# Patient Record
Sex: Female | Born: 1955 | Race: White | Hispanic: No | Marital: Married | Smoking: Never smoker
Health system: Southern US, Community
[De-identification: ages and names within clinical notes are randomized; demographics above are authoritative.]

## PROBLEM LIST (undated history)

## (undated) DIAGNOSIS — I10 Essential (primary) hypertension: Secondary | ICD-10-CM

---

## 2005-04-29 ENCOUNTER — Other Ambulatory Visit: Admission: RE | Admit: 2005-04-29 | Discharge: 2005-04-29 | Payer: Self-pay | Admitting: Internal Medicine

## 2005-06-27 ENCOUNTER — Encounter: Admission: RE | Admit: 2005-06-27 | Discharge: 2005-06-27 | Payer: Self-pay | Admitting: Internal Medicine

## 2006-04-16 ENCOUNTER — Ambulatory Visit: Payer: Self-pay | Admitting: Internal Medicine

## 2006-05-06 ENCOUNTER — Ambulatory Visit: Payer: Self-pay | Admitting: Internal Medicine

## 2007-02-02 ENCOUNTER — Encounter: Admission: RE | Admit: 2007-02-02 | Discharge: 2007-02-02 | Payer: Self-pay | Admitting: Internal Medicine

## 2007-04-13 ENCOUNTER — Ambulatory Visit: Payer: Self-pay | Admitting: Internal Medicine

## 2007-04-13 DIAGNOSIS — R519 Headache, unspecified: Secondary | ICD-10-CM | POA: Insufficient documentation

## 2007-04-13 DIAGNOSIS — R51 Headache: Secondary | ICD-10-CM | POA: Insufficient documentation

## 2007-04-13 DIAGNOSIS — G4700 Insomnia, unspecified: Secondary | ICD-10-CM | POA: Insufficient documentation

## 2007-04-13 DIAGNOSIS — L719 Rosacea, unspecified: Secondary | ICD-10-CM | POA: Insufficient documentation

## 2007-04-13 LAB — CONVERTED CEMR LAB
BUN: 11 mg/dL (ref 6–23)
Basophils Absolute: 0.1 10*3/uL (ref 0.0–0.1)
Basophils Relative: 1.2 % — ABNORMAL HIGH (ref 0.0–1.0)
CO2: 28 meq/L (ref 19–32)
Calcium: 9.6 mg/dL (ref 8.4–10.5)
Chloride: 102 meq/L (ref 96–112)
Creatinine, Ser: 1.1 mg/dL (ref 0.4–1.2)
Eosinophils Absolute: 0 10*3/uL (ref 0.0–0.6)
Eosinophils Relative: 0.8 % (ref 0.0–5.0)
GFR calc Af Amer: 68 mL/min
GFR calc non Af Amer: 56 mL/min
Glucose, Bld: 102 mg/dL — ABNORMAL HIGH (ref 70–99)
HCT: 36.5 % (ref 36.0–46.0)
Hemoglobin: 12.9 g/dL (ref 12.0–15.0)
Lymphocytes Relative: 19.8 % (ref 12.0–46.0)
MCHC: 35.3 g/dL (ref 30.0–36.0)
MCV: 91.9 fL (ref 78.0–100.0)
Monocytes Absolute: 0.3 10*3/uL (ref 0.2–0.7)
Monocytes Relative: 6.2 % (ref 3.0–11.0)
Neutro Abs: 3.7 10*3/uL (ref 1.4–7.7)
Neutrophils Relative %: 72 % (ref 43.0–77.0)
Platelets: 328 10*3/uL (ref 150–400)
Potassium: 4.3 meq/L (ref 3.5–5.1)
RBC: 3.97 M/uL (ref 3.87–5.11)
RDW: 12.7 % (ref 11.5–14.6)
Sodium: 137 meq/L (ref 135–145)
WBC: 5.1 10*3/uL (ref 4.5–10.5)

## 2007-04-22 ENCOUNTER — Ambulatory Visit: Payer: Self-pay | Admitting: Cardiology

## 2008-11-01 ENCOUNTER — Encounter: Admission: RE | Admit: 2008-11-01 | Discharge: 2008-11-01 | Payer: Self-pay | Admitting: Family Medicine

## 2008-12-21 ENCOUNTER — Other Ambulatory Visit: Admission: RE | Admit: 2008-12-21 | Discharge: 2008-12-21 | Payer: Self-pay | Admitting: Family Medicine

## 2009-11-05 ENCOUNTER — Encounter: Admission: RE | Admit: 2009-11-05 | Discharge: 2009-11-05 | Payer: Self-pay | Admitting: Family Medicine

## 2010-06-23 ENCOUNTER — Encounter: Payer: Self-pay | Admitting: Internal Medicine

## 2010-07-02 NOTE — Assessment & Plan Note (Signed)
Summary: mouth pain/cdj  Medications Added AMBIEN CR 6.25 MG TBCR (ZOLPIDEM TARTRATE) Take 1 tablet by mouth at bedtime SULFAMETHOXAZOLE-TMP DS 800-160 MG  TABS (SULFAMETHOXAZOLE-TRIMETHOPRIM) two times a day - RX'D BY DERM      Allergies Added: NKDA  Vital Signs:  Patient Profile:   55 Years Old Female Weight:      156.2 pounds Temp:     97.1 degrees F oral BP sitting:   180 / 100  Vitals Entered By: Shary Decamp (April 13, 2007 11:27 AM)                 Chief Complaint:  MOUTH PAIN X 7/08; WAS PUT ON DOXY BY DERM FOR ROSACEA - MOUTH PAIN STARTED 2 WEEKS AFTER THAT - DERM CHANGED TO BACTRIM - PT HAS ALSO SEEN DENTIST - WKUP WAS NORMAL; TONGUE COATED.  History of Present Illness: complain of pain at  the roof of the mouth and anterior gum  since July 2008 she has seen 2 dentists and they have determined after some x-rays that the pain is not coming from her teeth symptom started after she was prescribed doxycycline for rosacea,doxycycline was changed to Bactrim 4 weeks ago but the pain continued. the patient wonders if the symptoms were related to the  antibiotics  Current Allergies: No known allergies   Past Surgical History:    Tubal ligation    history of irregular periods,status post a D&C in 2005, a polyp was removed   Family History:    M - UTERINE CA    SIS - HODGKINS    Review of Systems  ENT      sinus congestion--no facial swelling no discoloration or mouth blisters   Physical Exam  General:     alert and well-developed.   Head:     face is symmetric, sinuses not tender Ears:     normal Nose:     not congested Mouth:     inspection of the mouth show no redness or lesion, palpation of the hard palate, tongue, teeth : normal. Neck:     no masses.      Impression & Recommendations:  Problem # 1:  FACIAL PAIN (ICD-784.0) CBC BMP CT Orders: TLB-BMP (Basic Metabolic Panel-BMET) (80048-METABOL) TLB-CBC Platelet - w/Differential  (85025-CBCD) Venipuncture (14782) Radiology Referral (Radiology)   Complete Medication List: 1)  Ambien Cr 6.25 Mg Tbcr (Zolpidem tartrate) .... Take 1 tablet by mouth at bedtime 2)  Sulfamethoxazole-tmp Ds 800-160 Mg Tabs (Sulfamethoxazole-trimethoprim) .... Two times a day - rx'd by derm     Prescriptions: AMBIEN CR 6.25 MG TBCR (ZOLPIDEM TARTRATE) Take 1 tablet by mouth at bedtime  #30 x 5   Entered by:   Shary Decamp   Authorized by:   Nolon Rod. Gitel Beste MD   Signed by:   Shary Decamp on 04/13/2007   Method used:   Print then Give to Patient   RxID:   [id number]  ]

## 2010-11-21 ENCOUNTER — Other Ambulatory Visit: Payer: Self-pay | Admitting: Family Medicine

## 2010-11-21 DIAGNOSIS — Z1231 Encounter for screening mammogram for malignant neoplasm of breast: Secondary | ICD-10-CM

## 2010-11-22 ENCOUNTER — Ambulatory Visit
Admission: RE | Admit: 2010-11-22 | Discharge: 2010-11-22 | Disposition: A | Discharge status: Home / Self Care | Payer: BC Managed Care – PPO | Source: Ambulatory Visit | Attending: Family Medicine | Admitting: Family Medicine

## 2010-11-22 DIAGNOSIS — Z1231 Encounter for screening mammogram for malignant neoplasm of breast: Secondary | ICD-10-CM

## 2012-03-30 ENCOUNTER — Other Ambulatory Visit: Payer: Self-pay | Admitting: Family Medicine

## 2012-03-30 DIAGNOSIS — Z1231 Encounter for screening mammogram for malignant neoplasm of breast: Secondary | ICD-10-CM

## 2012-04-05 ENCOUNTER — Other Ambulatory Visit (HOSPITAL_COMMUNITY)
Admission: RE | Admit: 2012-04-05 | Discharge: 2012-04-05 | Disposition: A | Discharge status: Home / Self Care | Payer: 59 | Source: Ambulatory Visit | Attending: Family Medicine | Admitting: Family Medicine

## 2012-04-05 DIAGNOSIS — Z124 Encounter for screening for malignant neoplasm of cervix: Secondary | ICD-10-CM | POA: Insufficient documentation

## 2012-04-05 DIAGNOSIS — Z1151 Encounter for screening for human papillomavirus (HPV): Secondary | ICD-10-CM | POA: Insufficient documentation

## 2012-04-06 ENCOUNTER — Other Ambulatory Visit: Payer: Self-pay | Admitting: Family Medicine

## 2012-04-06 DIAGNOSIS — R634 Abnormal weight loss: Secondary | ICD-10-CM

## 2012-04-12 ENCOUNTER — Ambulatory Visit
Admission: RE | Admit: 2012-04-12 | Discharge: 2012-04-12 | Disposition: A | Discharge status: Home / Self Care | Payer: 59 | Source: Ambulatory Visit | Attending: Family Medicine | Admitting: Family Medicine

## 2012-04-12 DIAGNOSIS — R634 Abnormal weight loss: Secondary | ICD-10-CM

## 2012-04-12 MED ORDER — IOHEXOL 300 MG/ML  SOLN
100.0000 mL | Freq: Once | INTRAMUSCULAR | Status: AC | PRN
  Administered 2012-04-12: 100 mL via INTRAVENOUS

## 2012-05-07 ENCOUNTER — Ambulatory Visit
Admission: RE | Admit: 2012-05-07 | Discharge: 2012-05-07 | Disposition: A | Discharge status: Home / Self Care | Payer: 59 | Source: Ambulatory Visit | Attending: Family Medicine | Admitting: Family Medicine

## 2012-05-07 DIAGNOSIS — Z1231 Encounter for screening mammogram for malignant neoplasm of breast: Secondary | ICD-10-CM

## 2013-06-21 ENCOUNTER — Other Ambulatory Visit: Payer: Self-pay

## 2013-06-21 DIAGNOSIS — Z1231 Encounter for screening mammogram for malignant neoplasm of breast: Secondary | ICD-10-CM

## 2013-06-30 ENCOUNTER — Ambulatory Visit
Admission: RE | Admit: 2013-06-30 | Discharge: 2013-06-30 | Disposition: A | Discharge status: Home / Self Care | Payer: BC Managed Care – PPO | Source: Ambulatory Visit

## 2013-06-30 DIAGNOSIS — Z1231 Encounter for screening mammogram for malignant neoplasm of breast: Secondary | ICD-10-CM

## 2014-05-14 IMAGING — CT CT ABD-PELV W/ CM
3 of 5 series · 13 of 36 positions shown, 19 images · IV contrast (READICAT/WATER & [ID] OMNI 300)
Comparison: None.

CLINICAL DATA: Unintentional 20 pounds weight loss during past 10
months.

CT ABDOMEN AND PELVIS WITH CONTRAST
TECHNIQUE: Multidetector CT imaging of the abdomen and pelvis was
performed following the standard protocol during bolus
administration of intravenous contrast.
Contrast: 100mL OMNIPAQUE IOHEXOL 300 MG/ML  SOLN

[Series 3: abd/pelvis with · axial · 0.70mm/px · z∈[-327,+23]mm · 8 of 90 slices shown, 13 images]
[im 10/90  soft-tissue]
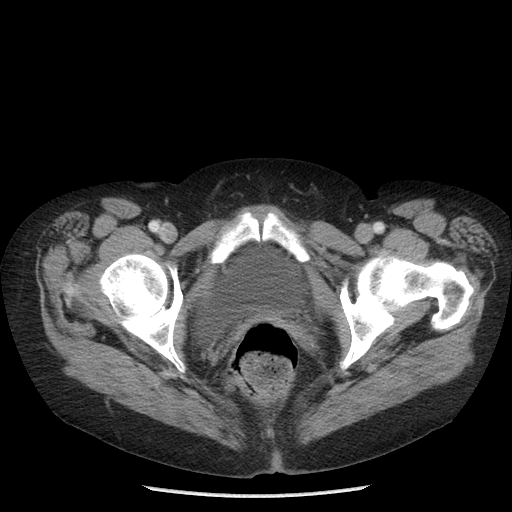
[im 10/90  bone]
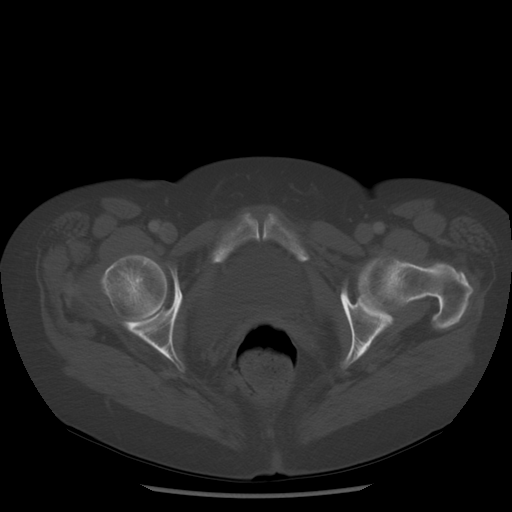
[im 20/90  soft-tissue]
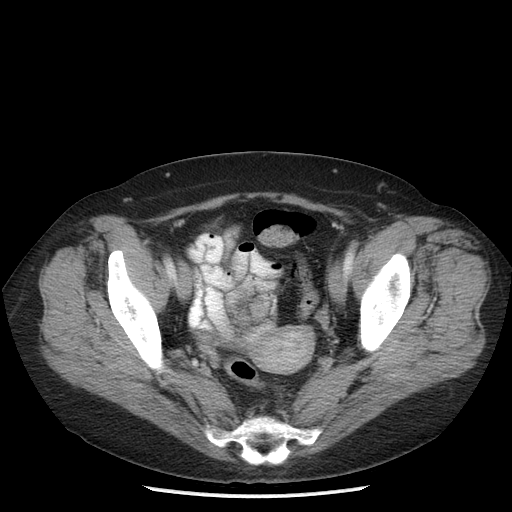
[im 30/90  soft-tissue]
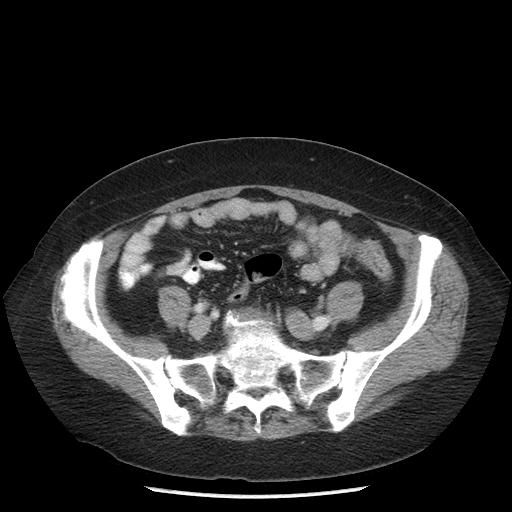
[im 40/90  soft-tissue]
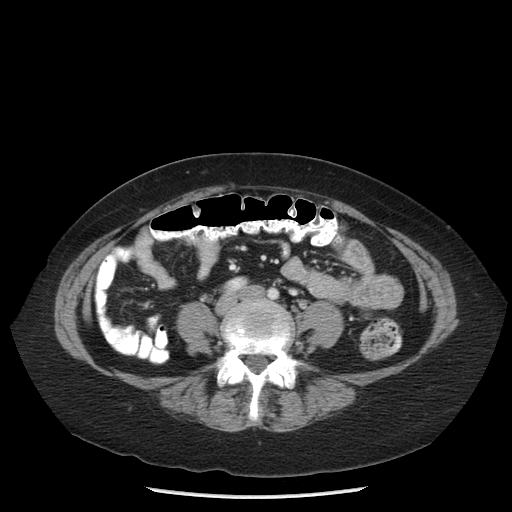
[im 50/90  soft-tissue]
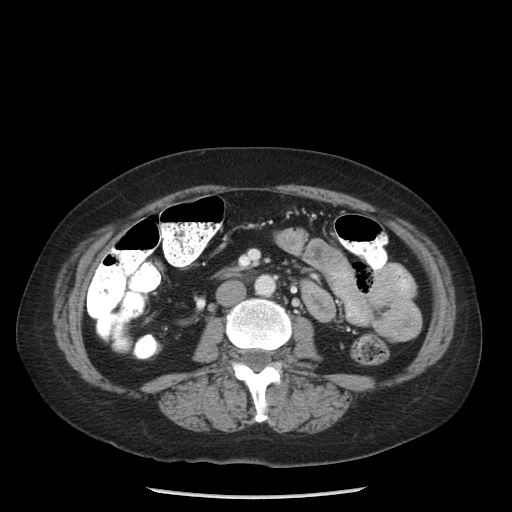
[im 50/90  lung]
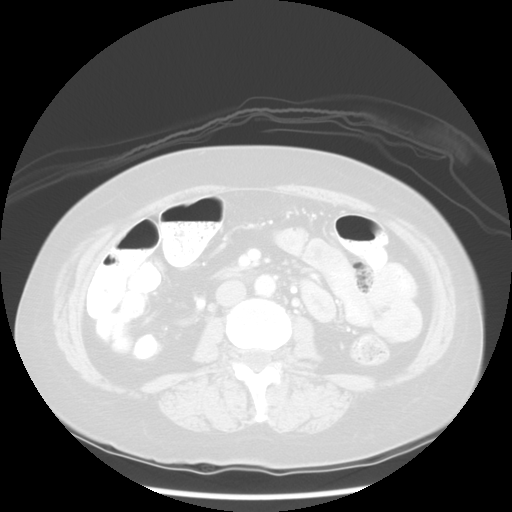
[im 60/90  soft-tissue]
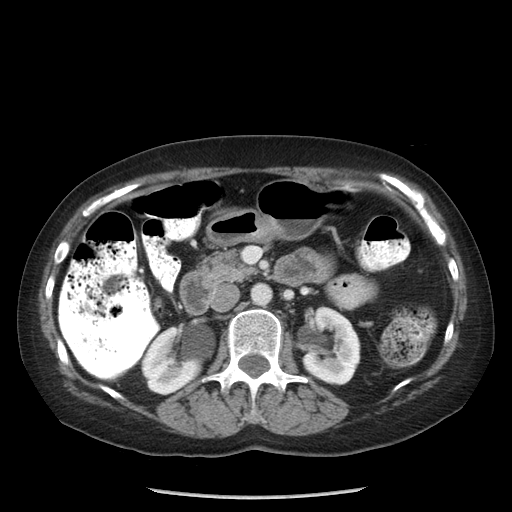
[im 60/90  lung]
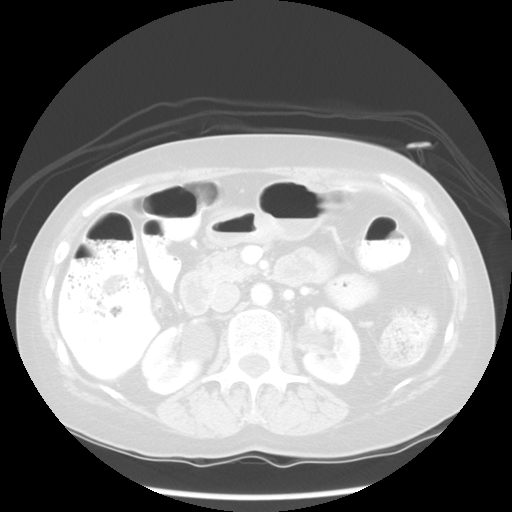
[im 70/90  soft-tissue]
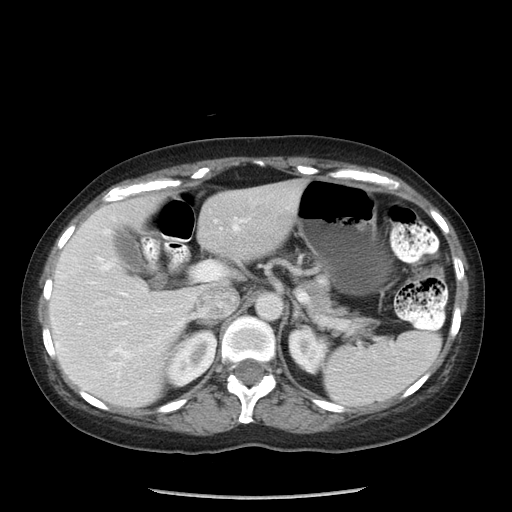
[im 70/90  lung]
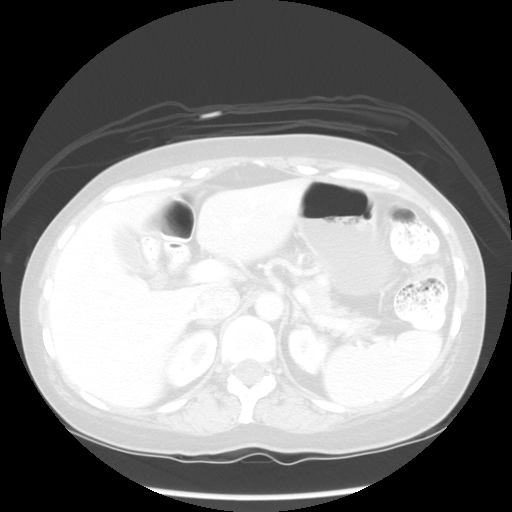
[im 80/90  soft-tissue]
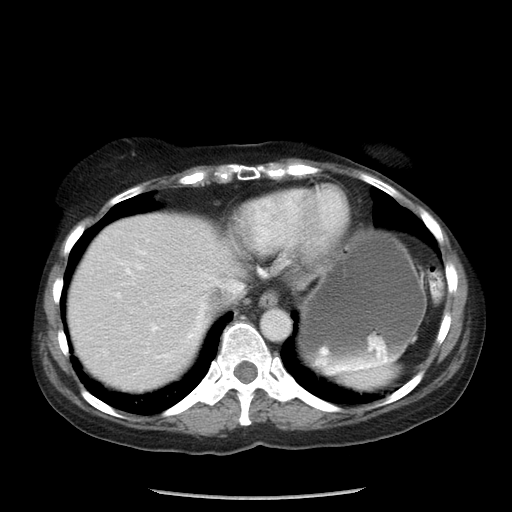
[im 80/90  lung]
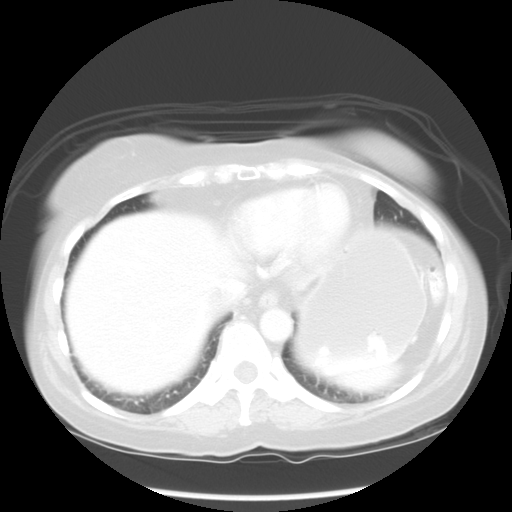

[Series 601: coronal body · coronal · 0.93mm/px · 1 of 106 slices shown, 2 images]
[im 36/106  soft-tissue]
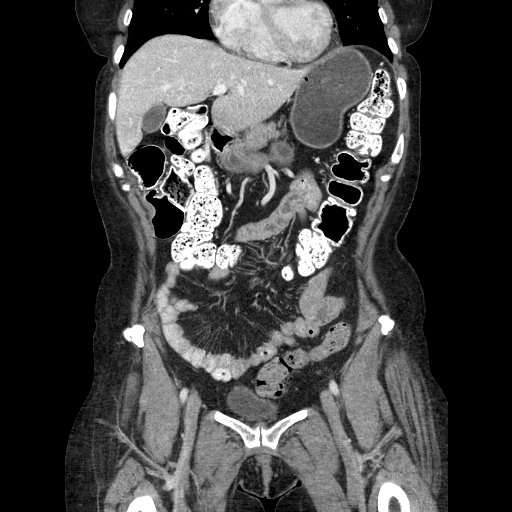
[im 36/106  bone]
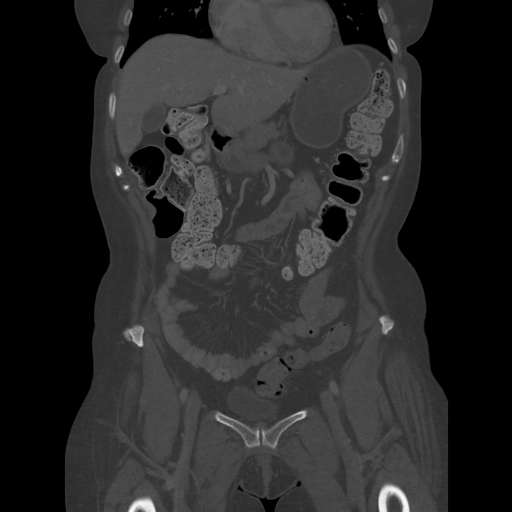

[Series 602: sagittal body · sagittal · 0.93mm/px · 4 of 145 slices shown]
[im 10/145  soft-tissue]
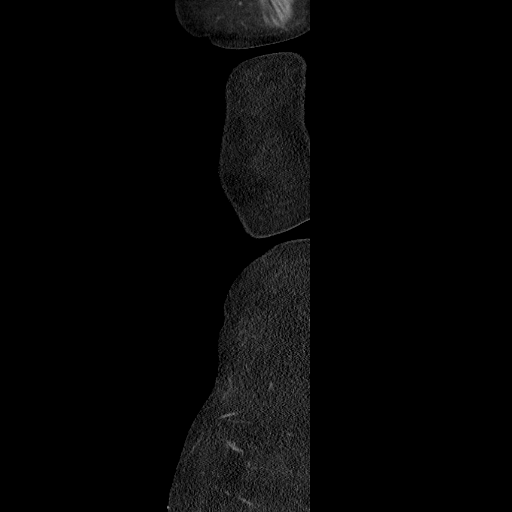
[im 28/145  soft-tissue]
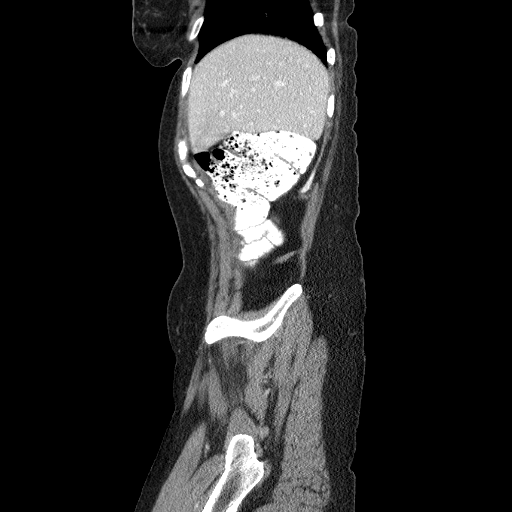
[im 46/145  soft-tissue]
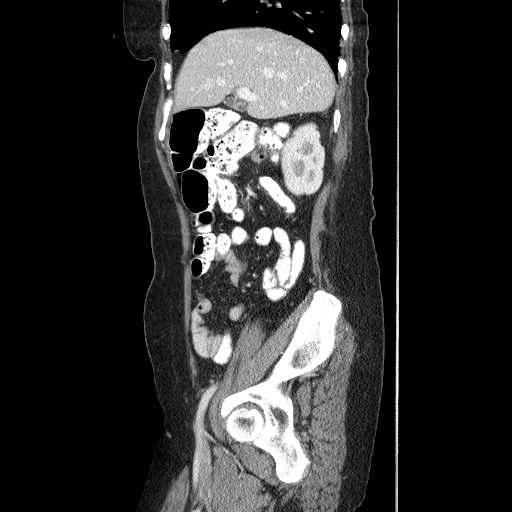
[im 64/145  soft-tissue]
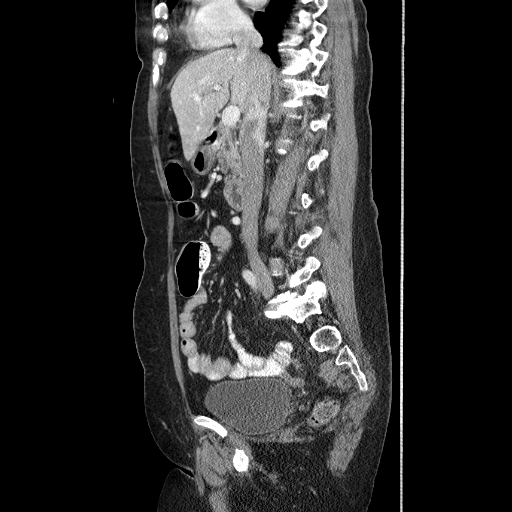

[13 of 36 positions shown; findings below may reference images not displayed]

FINDINGS: The liver, gallbladder, pancreas, spleen, and adrenal
glands are normal in appearance.  A left renal parapelvic cyst is
noted, however there is no evidence of renal masses or
hydronephrosis.  Uterus and adnexa are unremarkable in appearance.

No soft tissue masses or lymphadenopathy identified within the
abdomen or pelvis.  No evidence of inflammatory process or abnormal
fluid collections.  No evidence of bowel wall thickening,
dilatation, or hernia.
IMPRESSION: No evidence of neoplasm or other clinically significant
abnormality.

## 2014-09-11 ENCOUNTER — Other Ambulatory Visit: Payer: Self-pay

## 2014-09-11 DIAGNOSIS — Z1231 Encounter for screening mammogram for malignant neoplasm of breast: Secondary | ICD-10-CM

## 2014-09-15 ENCOUNTER — Ambulatory Visit: Payer: Self-pay

## 2014-09-18 ENCOUNTER — Other Ambulatory Visit: Payer: Self-pay

## 2014-09-18 ENCOUNTER — Ambulatory Visit
Admission: RE | Admit: 2014-09-18 | Discharge: 2014-09-18 | Disposition: A | Discharge status: Home / Self Care | Payer: BLUE CROSS/BLUE SHIELD | Source: Ambulatory Visit

## 2014-09-18 DIAGNOSIS — Z1231 Encounter for screening mammogram for malignant neoplasm of breast: Secondary | ICD-10-CM

## 2015-05-02 ENCOUNTER — Emergency Department (HOSPITAL_COMMUNITY): Payer: Worker's Compensation

## 2015-05-02 ENCOUNTER — Emergency Department (HOSPITAL_COMMUNITY)
Admission: EM | Admit: 2015-05-02 | Discharge: 2015-05-02 | Disposition: A | Discharge status: Home / Self Care | Payer: Worker's Compensation | Attending: Emergency Medicine | Admitting: Emergency Medicine

## 2015-05-02 ENCOUNTER — Encounter (HOSPITAL_COMMUNITY): Payer: Self-pay | Admitting: *Deleted

## 2015-05-02 DIAGNOSIS — Y9289 Other specified places as the place of occurrence of the external cause: Secondary | ICD-10-CM | POA: Diagnosis not present

## 2015-05-02 DIAGNOSIS — T23122A Burn of first degree of single left finger (nail) except thumb, initial encounter: Secondary | ICD-10-CM | POA: Insufficient documentation

## 2015-05-02 DIAGNOSIS — R52 Pain, unspecified: Secondary | ICD-10-CM

## 2015-05-02 DIAGNOSIS — S6992XA Unspecified injury of left wrist, hand and finger(s), initial encounter: Secondary | ICD-10-CM | POA: Diagnosis present

## 2015-05-02 DIAGNOSIS — Y990 Civilian activity done for income or pay: Secondary | ICD-10-CM | POA: Diagnosis not present

## 2015-05-02 DIAGNOSIS — Z79899 Other long term (current) drug therapy: Secondary | ICD-10-CM | POA: Insufficient documentation

## 2015-05-02 DIAGNOSIS — I10 Essential (primary) hypertension: Secondary | ICD-10-CM | POA: Diagnosis not present

## 2015-05-02 DIAGNOSIS — Y9389 Activity, other specified: Secondary | ICD-10-CM | POA: Insufficient documentation

## 2015-05-02 DIAGNOSIS — X152XXA Contact with hotplate, initial encounter: Secondary | ICD-10-CM | POA: Diagnosis not present

## 2015-05-02 DIAGNOSIS — T300 Burn of unspecified body region, unspecified degree: Secondary | ICD-10-CM

## 2015-05-02 HISTORY — DX: Essential (primary) hypertension: I10

## 2015-05-02 MED ORDER — BACITRACIN ZINC 500 UNIT/GM EX OINT
1.0000 "application " | TOPICAL_OINTMENT | Freq: Two times a day (BID) | CUTANEOUS | Status: DC
  Administered 2015-05-02: 1 via TOPICAL

## 2015-05-02 NOTE — ED Notes (Signed)
Per EMS- pt was placing dishes in the dryer at work when the machine closed on her middle and ring finger on left hand. No visible deformity. Pt noted to have red marks across her fingers. Pt with ice on fingers from EMS. Pt was given of fentanyl en route.

## 2015-05-02 NOTE — ED Notes (Signed)
Pt transported to xray 

## 2015-05-02 NOTE — Discharge Instructions (Signed)
Burn Care Your skin is a natural barrier to infection. It is the largest organ of your body. Burns damage this natural protection. To help prevent infection, it is very important to follow your caregiver's instructions in the care of your burn. Burns are classified as:  First degree. There is only redness of the skin (erythema). No scarring is expected.  Second degree. There is blistering of the skin. Scarring may occur with deeper burns.  Third degree. All layers of the skin are injured, and scarring is expected. HOME CARE INSTRUCTIONS   Wash your hands well before changing your bandage.  Change your bandage as often as directed by your caregiver.  Remove the old bandage. If the bandage sticks, you may soak it off with cool, clean water.  Cleanse the burn thoroughly but gently with mild soap and water.  Pat the area dry with a clean, dry cloth.  Apply a thin layer of antibacterial cream to the burn.  Apply a clean bandage as instructed by your caregiver.  Keep the bandage as clean and dry as possible.  Elevate the affected area for the first 24 hours, then as instructed by your caregiver.  Only take over-the-counter or prescription medicines for pain, discomfort, or fever as directed by your caregiver. SEEK IMMEDIATE MEDICAL CARE IF:   You develop excessive pain.  You develop redness, tenderness, swelling, or red streaks near the burn.  The burned area develops yellowish-white fluid (pus) or a bad smell.  You have a fever. MAKE SURE YOU:   Understand these instructions.  Will watch your condition.  Will get help right away if you are not doing well or get worse.   This information is not intended to replace advice given to you by your health care provider. Make sure you discuss any questions you have with your health care provider.   Document Released: 05/19/2005 Document Revised: 08/11/2011 Document Reviewed: 10/09/2010 Elsevier Interactive Patient Education  2016 [employer] Crush Injury, Fingers or Toes A crush injury to the fingers or toes means the tissues have been damaged by being squeezed (compressed). There will be bleeding into the tissues and swelling. Often, blood will collect under the skin. When this happens, the skin on the finger often dies and may slough off (shed) 1 week to 10 days later. Usually, new skin is growing underneath. If the injury has been too severe and the tissue does not survive, the damaged tissue may begin to turn black over several days.  Wounds which occur because of the crushing may be stitched (sutured) shut. However, crush injuries are more likely to become infected than other injuries.These wounds may not be closed as tightly as other types of cuts to prevent infection. Nails involved are often lost. These usually grow back over several weeks.  DIAGNOSIS X-rays may be taken to see if there is any injury to the bones. TREATMENT Broken bones (fractures) may be treated with splinting, depending on the fracture. Often, no treatment is required for fractures of the last bone in the fingers or toes. HOME CARE INSTRUCTIONS   The crushed part should be raised (elevated) above the heart or center of the chest as much as possible for the first several days or as directed. This helps with pain and lessens swelling. Less swelling increases the chances that the crushed part will survive.  Put ice on the injured area.  Put ice in a plastic bag.  Place a towel between your skin and the bag.  Leave the  ice on for 15-20 minutes, 03-04 times a day for the first 2 days.  Only take over-the-counter or prescription medicines for pain, discomfort, or fever as directed by your caregiver.  Use your injured part only as directed.  Change your bandages (dressings) as directed.  Keep all follow-up appointments as directed by your caregiver. Not keeping your appointment could result in a chronic or permanent injury, pain, and  disability. If there is any problem keeping the appointment, you must call to reschedule. SEEK IMMEDIATE MEDICAL CARE IF:   There is redness, swelling, or increasing pain in the wound area.  Pus is coming from the wound.  You have a fever.  You notice a bad smell coming from the wound or dressing.  The edges of the wound do not stay together after the sutures have been removed.  You are unable to move the injured finger or toe. MAKE SURE YOU:   Understand these instructions.  Will watch your condition.  Will get help right away if you are not doing well or get worse.   This information is not intended to replace advice given to you by your health care provider. Make sure you discuss any questions you have with your health care provider.   Document Released: 05/19/2005 Document Revised: 08/11/2011 Document Reviewed: 10/04/2010 Elsevier Interactive Patient Education [employer]

## 2015-05-02 NOTE — ED Provider Notes (Signed)
CSN: [id number]     Arrival date & time 05/02/15  1019 History   First MD Initiated Contact with Patient 05/02/15 1019     Chief Complaint  Patient presents with  . Finger Injury     (Consider location/radiation/quality/duration/timing/severity/associated sxs/prior Treatment) HPI Comments: Patient presents to the emergency department with chief complaint of left index, middle, and ring finger injury. She states that her fingers got pinched in a plate warmer while at work today. She complains of a mild burn on her index finger. She states that her pain is mild. There are no aggravating or alleviating factors. She denies any difficulty with moving her fingers.  The history is provided by the patient. No language interpreter was used.    Past Medical History  Diagnosis Date  . Hypertension    History reviewed. No pertinent past surgical history. No family history on file. Social History  Substance Use Topics  . Smoking status: Never Smoker   . Smokeless tobacco: None  . Alcohol Use: None   OB History    No data available     Review of Systems  Constitutional: Negative for fever and chills.  Respiratory: Negative for shortness of breath.   Cardiovascular: Negative for chest pain.  Gastrointestinal: Negative for nausea, vomiting, diarrhea and constipation.  Genitourinary: Negative for dysuria.  Musculoskeletal: Positive for joint swelling.      Allergies  Review of patient's allergies indicates no known allergies.  Home Medications   Prior to Admission medications   Medication Sig Start Date End Date Taking? Authorizing Provider  MELATONIN PO Take 2 tablets by mouth at bedtime.    Yes Historical Provider, MD  nortriptyline (PAMELOR) 25 MG capsule Take 75 mg by mouth at bedtime.   Yes Historical Provider, MD   BP 142/80 mmHg  Pulse 77  Temp(Src) 98.2 F (36.8 C) (Oral)  Resp 16  SpO2 94% Physical Exam  Constitutional: She is oriented to person, place, and time.  She appears well-developed and well-nourished.  HENT:  Head: Normocephalic and atraumatic.  Eyes: Conjunctivae and EOM are normal.  Neck: Normal range of motion.  Cardiovascular: Normal rate and intact distal pulses.   Brisk capillary refill  Pulmonary/Chest: Effort normal.  Abdominal: She exhibits no distension.  Musculoskeletal: Normal range of motion.  Range of motion and strength of left fingers, hand, wrist are 5/5, resisted and isolated at each joint, no bony abnormality or deformity  Neurological: She is alert and oriented to person, place, and time.  Skin: Skin is dry.  First-degree burn to posterior aspect of left index finger at the distal phalanx  Psychiatric: She has a normal mood and affect. Her behavior is normal. Judgment and thought content normal.  Nursing note and vitals reviewed.   ED Course  Procedures (including critical care time)  Imaging Review Dg Hand Complete Left  05/02/2015  CLINICAL DATA:  Acute left hand pain after injury today. Initial encounter. EXAM: LEFT HAND - COMPLETE 3+ VIEW COMPARISON:  None. FINDINGS: There is no evidence of fracture or dislocation. There is no evidence of arthropathy or other focal bone abnormality. Soft tissues are unremarkable. IMPRESSION: Normal left hand. Electronically Signed   By: Lupita Raider, M.D.   On: 05/02/2015 11:39   I have personally reviewed and evaluated these images and lab results as part of my medical decision-making.    MDM   Final diagnoses:  Finger injury, left, initial encounter  Superficial burn    Patient with hand injury. Plain  films are negative. Patient does have a small burn on her distal index finger. It is not circumferential. It is a first-degree burn. Will treat with bacitracin. Patient feels well. No suspicion of ligamentous injury. Patient is stable and ready for discharge to home.    Roxy Horsemanobert Cincere Zorn, PA-C 05/02/15 1232  Benjiman CoreNathan Pickering, MD 05/03/15 [phone]

## 2017-03-23 ENCOUNTER — Other Ambulatory Visit: Payer: Self-pay | Admitting: Family Medicine

## 2017-03-23 DIAGNOSIS — Z1231 Encounter for screening mammogram for malignant neoplasm of breast: Secondary | ICD-10-CM

## 2017-03-25 ENCOUNTER — Other Ambulatory Visit (HOSPITAL_COMMUNITY)
Admission: RE | Admit: 2017-03-25 | Discharge: 2017-03-25 | Disposition: A | Discharge status: Home / Self Care | Payer: Managed Care, Other (non HMO) | Source: Ambulatory Visit | Attending: Family Medicine | Admitting: Family Medicine

## 2017-03-25 ENCOUNTER — Other Ambulatory Visit: Payer: Self-pay | Admitting: Family Medicine

## 2017-03-25 DIAGNOSIS — Z124 Encounter for screening for malignant neoplasm of cervix: Secondary | ICD-10-CM | POA: Diagnosis not present

## 2017-03-26 ENCOUNTER — Other Ambulatory Visit: Payer: Self-pay | Admitting: Family Medicine

## 2017-03-26 DIAGNOSIS — N63 Unspecified lump in unspecified breast: Secondary | ICD-10-CM

## 2017-03-26 DIAGNOSIS — R922 Inconclusive mammogram: Secondary | ICD-10-CM

## 2017-03-27 ENCOUNTER — Other Ambulatory Visit: Payer: Self-pay | Admitting: Family Medicine

## 2017-03-27 DIAGNOSIS — N63 Unspecified lump in unspecified breast: Secondary | ICD-10-CM

## 2017-03-27 DIAGNOSIS — R922 Inconclusive mammogram: Secondary | ICD-10-CM

## 2017-03-27 LAB — CYTOLOGY - PAP
Diagnosis: NEGATIVE
HPV: NOT DETECTED

## 2017-04-01 ENCOUNTER — Ambulatory Visit
Admission: RE | Admit: 2017-04-01 | Discharge: 2017-04-01 | Disposition: A | Discharge status: Home / Self Care | Payer: Managed Care, Other (non HMO) | Source: Ambulatory Visit | Attending: Family Medicine | Admitting: Family Medicine

## 2017-04-01 DIAGNOSIS — N63 Unspecified lump in unspecified breast: Secondary | ICD-10-CM

## 2017-04-01 DIAGNOSIS — R922 Inconclusive mammogram: Secondary | ICD-10-CM

## 2017-04-01 DIAGNOSIS — R9230 Dense breasts, unspecified: Secondary | ICD-10-CM

## 2018-07-30 ENCOUNTER — Other Ambulatory Visit: Payer: Self-pay | Admitting: Family Medicine

## 2018-07-30 DIAGNOSIS — Z1231 Encounter for screening mammogram for malignant neoplasm of breast: Secondary | ICD-10-CM

## 2018-08-27 ENCOUNTER — Ambulatory Visit: Payer: Managed Care, Other (non HMO)

## 2019-05-25 ENCOUNTER — Other Ambulatory Visit: Payer: Self-pay

## 2019-05-25 ENCOUNTER — Ambulatory Visit
Admission: RE | Admit: 2019-05-25 | Discharge: 2019-05-25 | Disposition: A | Discharge status: Home / Self Care | Payer: Managed Care, Other (non HMO) | Source: Ambulatory Visit | Attending: Family Medicine | Admitting: Family Medicine

## 2019-05-25 DIAGNOSIS — Z1231 Encounter for screening mammogram for malignant neoplasm of breast: Secondary | ICD-10-CM

## 2020-07-13 ENCOUNTER — Other Ambulatory Visit: Payer: Self-pay | Admitting: Family Medicine

## 2020-07-13 DIAGNOSIS — Z1231 Encounter for screening mammogram for malignant neoplasm of breast: Secondary | ICD-10-CM

## 2020-09-04 ENCOUNTER — Ambulatory Visit: Payer: Managed Care, Other (non HMO)

## 2020-09-21 ENCOUNTER — Inpatient Hospital Stay: Admission: RE | Admit: 2020-09-21 | Payer: Managed Care, Other (non HMO) | Source: Ambulatory Visit

## 2020-09-27 ENCOUNTER — Ambulatory Visit
Admission: RE | Admit: 2020-09-27 | Discharge: 2020-09-27 | Disposition: A | Discharge status: Home / Self Care | Payer: Managed Care, Other (non HMO) | Source: Ambulatory Visit | Attending: Family Medicine | Admitting: Family Medicine

## 2020-09-27 ENCOUNTER — Other Ambulatory Visit: Payer: Self-pay

## 2020-09-27 DIAGNOSIS — Z1231 Encounter for screening mammogram for malignant neoplasm of breast: Secondary | ICD-10-CM

## 2022-01-03 ENCOUNTER — Other Ambulatory Visit: Payer: Self-pay | Admitting: Family Medicine

## 2022-01-03 DIAGNOSIS — Z1231 Encounter for screening mammogram for malignant neoplasm of breast: Secondary | ICD-10-CM

## 2022-01-15 ENCOUNTER — Ambulatory Visit: Payer: Managed Care, Other (non HMO)

## 2022-01-29 ENCOUNTER — Ambulatory Visit
Admission: RE | Admit: 2022-01-29 | Discharge: 2022-01-29 | Disposition: A | Discharge status: Home / Self Care | Payer: Managed Care, Other (non HMO) | Source: Ambulatory Visit | Attending: Family Medicine | Admitting: Family Medicine

## 2022-01-29 DIAGNOSIS — Z1231 Encounter for screening mammogram for malignant neoplasm of breast: Secondary | ICD-10-CM

## 2023-05-13 DIAGNOSIS — I10 Essential (primary) hypertension: Secondary | ICD-10-CM | POA: Diagnosis not present

## 2023-05-13 DIAGNOSIS — Z8571 Personal history of Hodgkin lymphoma: Secondary | ICD-10-CM | POA: Diagnosis not present

## 2023-05-13 DIAGNOSIS — G4700 Insomnia, unspecified: Secondary | ICD-10-CM | POA: Diagnosis not present

## 2023-05-13 DIAGNOSIS — Z008 Encounter for other general examination: Secondary | ICD-10-CM | POA: Diagnosis not present

## 2023-05-13 DIAGNOSIS — E785 Hyperlipidemia, unspecified: Secondary | ICD-10-CM | POA: Diagnosis not present

## 2023-05-13 DIAGNOSIS — Z823 Family history of stroke: Secondary | ICD-10-CM | POA: Diagnosis not present

## 2023-07-13 ENCOUNTER — Other Ambulatory Visit: Payer: Self-pay | Admitting: Family Medicine

## 2023-07-13 DIAGNOSIS — Z1231 Encounter for screening mammogram for malignant neoplasm of breast: Secondary | ICD-10-CM

## 2023-07-24 ENCOUNTER — Ambulatory Visit: Payer: Managed Care, Other (non HMO)

## 2023-07-24 ENCOUNTER — Ambulatory Visit
Admission: RE | Admit: 2023-07-24 | Discharge: 2023-07-24 | Disposition: A | Discharge status: Home / Self Care | Payer: Medicare HMO | Source: Ambulatory Visit | Attending: Family Medicine | Admitting: Family Medicine

## 2023-07-24 DIAGNOSIS — Z1231 Encounter for screening mammogram for malignant neoplasm of breast: Secondary | ICD-10-CM

## 2023-09-04 DIAGNOSIS — B351 Tinea unguium: Secondary | ICD-10-CM | POA: Diagnosis not present

## 2023-09-04 DIAGNOSIS — Z6824 Body mass index (BMI) 24.0-24.9, adult: Secondary | ICD-10-CM | POA: Diagnosis not present

## 2023-09-04 DIAGNOSIS — M79672 Pain in left foot: Secondary | ICD-10-CM | POA: Diagnosis not present

## 2023-09-04 DIAGNOSIS — L719 Rosacea, unspecified: Secondary | ICD-10-CM | POA: Diagnosis not present

## 2023-10-28 DIAGNOSIS — D485 Neoplasm of uncertain behavior of skin: Secondary | ICD-10-CM | POA: Diagnosis not present

## 2023-10-28 DIAGNOSIS — D225 Melanocytic nevi of trunk: Secondary | ICD-10-CM | POA: Diagnosis not present

## 2023-11-05 ENCOUNTER — Ambulatory Visit (INDEPENDENT_AMBULATORY_CARE_PROVIDER_SITE_OTHER): Payer: Managed Care, Other (non HMO) | Admitting: Otolaryngology

## 2023-11-17 DIAGNOSIS — G44209 Tension-type headache, unspecified, not intractable: Secondary | ICD-10-CM | POA: Diagnosis not present

## 2023-11-17 DIAGNOSIS — I10 Essential (primary) hypertension: Secondary | ICD-10-CM | POA: Diagnosis not present

## 2023-11-17 DIAGNOSIS — L719 Rosacea, unspecified: Secondary | ICD-10-CM | POA: Diagnosis not present

## 2023-11-24 DIAGNOSIS — H524 Presbyopia: Secondary | ICD-10-CM | POA: Diagnosis not present

## 2023-12-18 DIAGNOSIS — K146 Glossodynia: Secondary | ICD-10-CM | POA: Diagnosis not present

## 2024-01-24 DIAGNOSIS — N3001 Acute cystitis with hematuria: Secondary | ICD-10-CM | POA: Diagnosis not present

## 2024-02-03 DIAGNOSIS — G4700 Insomnia, unspecified: Secondary | ICD-10-CM | POA: Diagnosis not present

## 2024-02-03 DIAGNOSIS — Z0000 Encounter for general adult medical examination without abnormal findings: Secondary | ICD-10-CM | POA: Diagnosis not present

## 2024-02-03 DIAGNOSIS — G44209 Tension-type headache, unspecified, not intractable: Secondary | ICD-10-CM | POA: Diagnosis not present

## 2024-02-03 DIAGNOSIS — I10 Essential (primary) hypertension: Secondary | ICD-10-CM | POA: Diagnosis not present

## 2024-02-03 DIAGNOSIS — Z6824 Body mass index (BMI) 24.0-24.9, adult: Secondary | ICD-10-CM | POA: Diagnosis not present

## 2024-02-03 DIAGNOSIS — K146 Glossodynia: Secondary | ICD-10-CM | POA: Diagnosis not present

## 2024-02-03 DIAGNOSIS — E785 Hyperlipidemia, unspecified: Secondary | ICD-10-CM | POA: Diagnosis not present

## 2024-02-03 DIAGNOSIS — R7303 Prediabetes: Secondary | ICD-10-CM | POA: Diagnosis not present

## 2024-02-03 DIAGNOSIS — Z23 Encounter for immunization: Secondary | ICD-10-CM | POA: Diagnosis not present

## 2024-08-01 ENCOUNTER — Ambulatory Visit: Admitting: Neurology
